# Patient Record
Sex: Male | Born: 1965 | Race: White | Hispanic: No | Marital: Married | Smoking: Current every day smoker
Health system: Southern US, Community
[De-identification: ages and names within clinical notes are randomized; demographics above are authoritative.]

## PROBLEM LIST (undated history)

## (undated) DIAGNOSIS — R911 Solitary pulmonary nodule: Secondary | ICD-10-CM

## (undated) DIAGNOSIS — I700 Atherosclerosis of aorta: Secondary | ICD-10-CM

## (undated) DIAGNOSIS — J439 Emphysema, unspecified: Secondary | ICD-10-CM

## (undated) HISTORY — PX: VENOUS THROMBECTOMY: SHX834

## (undated) HISTORY — DX: Solitary pulmonary nodule: R91.1

## (undated) HISTORY — DX: Emphysema, unspecified: J43.9

## (undated) HISTORY — DX: Atherosclerosis of aorta: I70.0

## (undated) HISTORY — PX: APPENDECTOMY: SHX54

---

## 2005-12-16 ENCOUNTER — Emergency Department (HOSPITAL_COMMUNITY): Admission: EM | Admit: 2005-12-16 | Discharge: 2005-12-16 | Payer: Self-pay | Admitting: Emergency Medicine

## 2005-12-21 ENCOUNTER — Ambulatory Visit (HOSPITAL_COMMUNITY): Admission: RE | Admit: 2005-12-21 | Discharge: 2005-12-21 | Payer: Self-pay | Admitting: *Deleted

## 2007-06-16 IMAGING — CR DG CHEST 2V
2 series · 2 of 2 positions shown · non-contrast
Comparison: None.

CLINICAL DATA: Pre-op/thrombophlebitis left leg. 
 CHEST - 2 VIEW:

[view not recorded (1 of 2)]
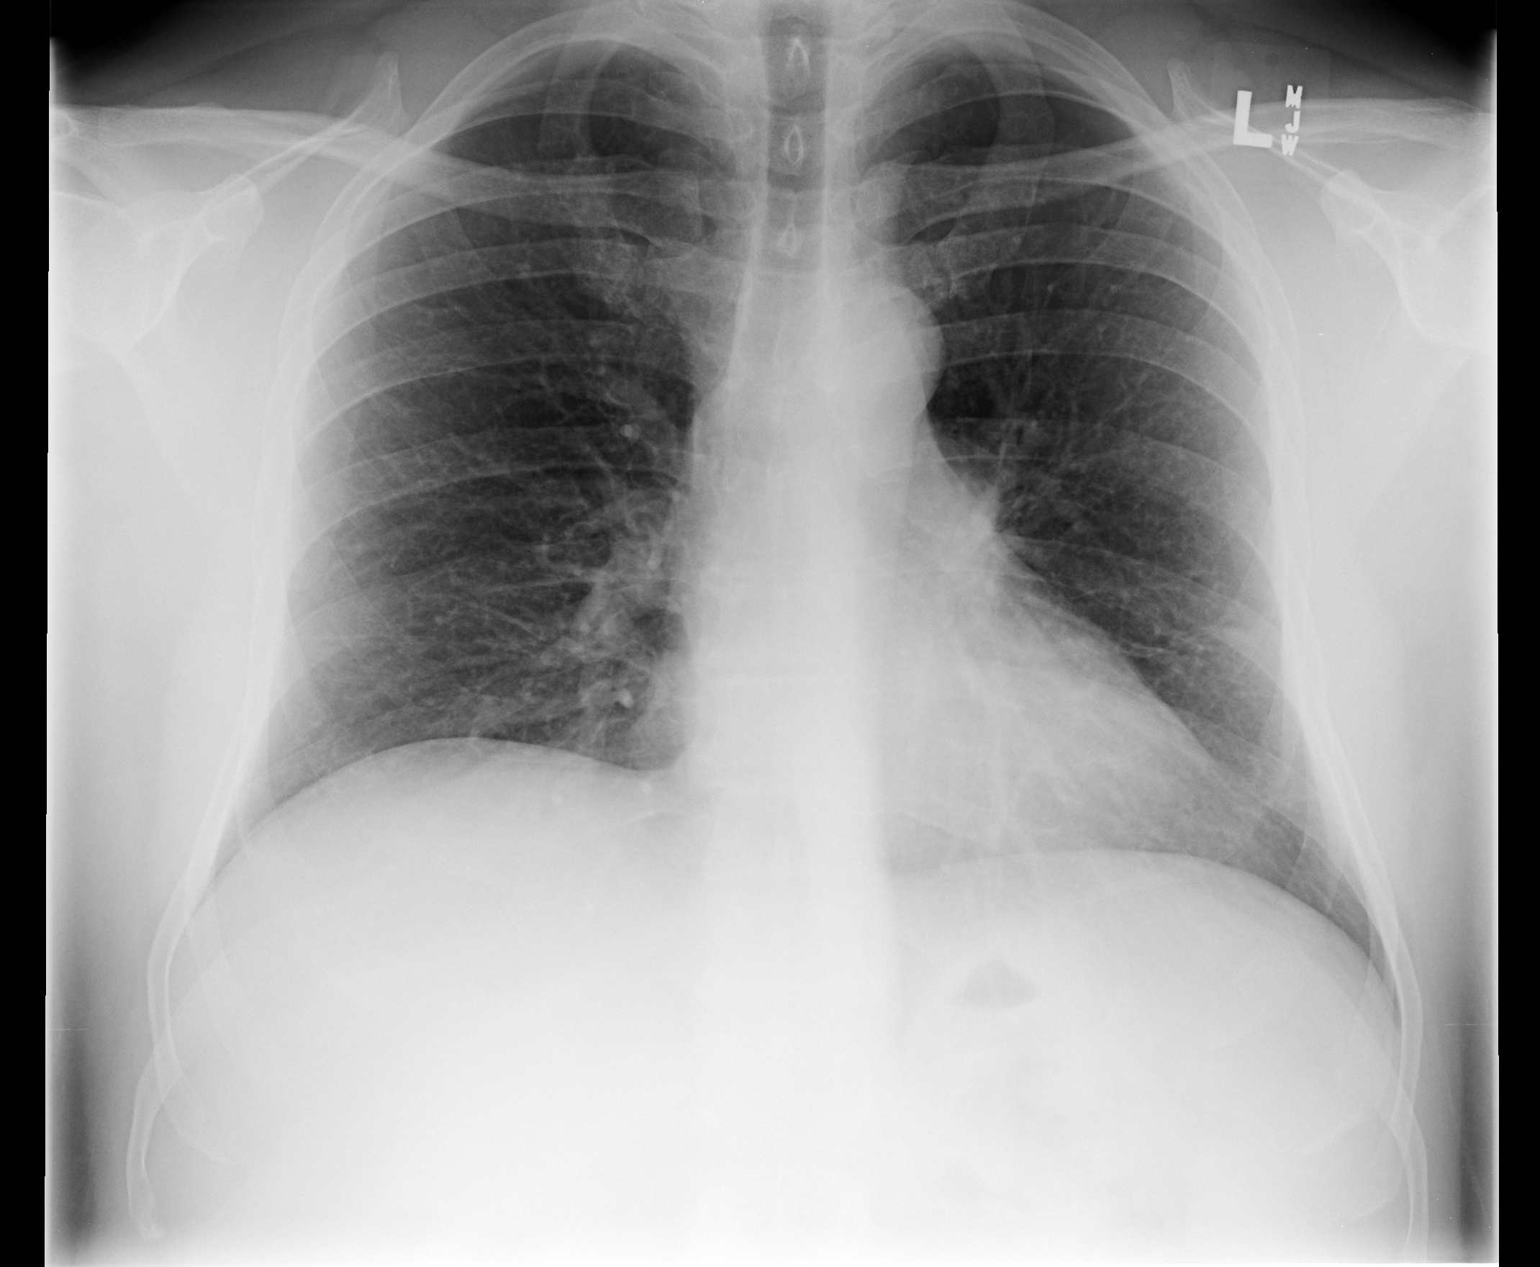

[view not recorded (2 of 2)]
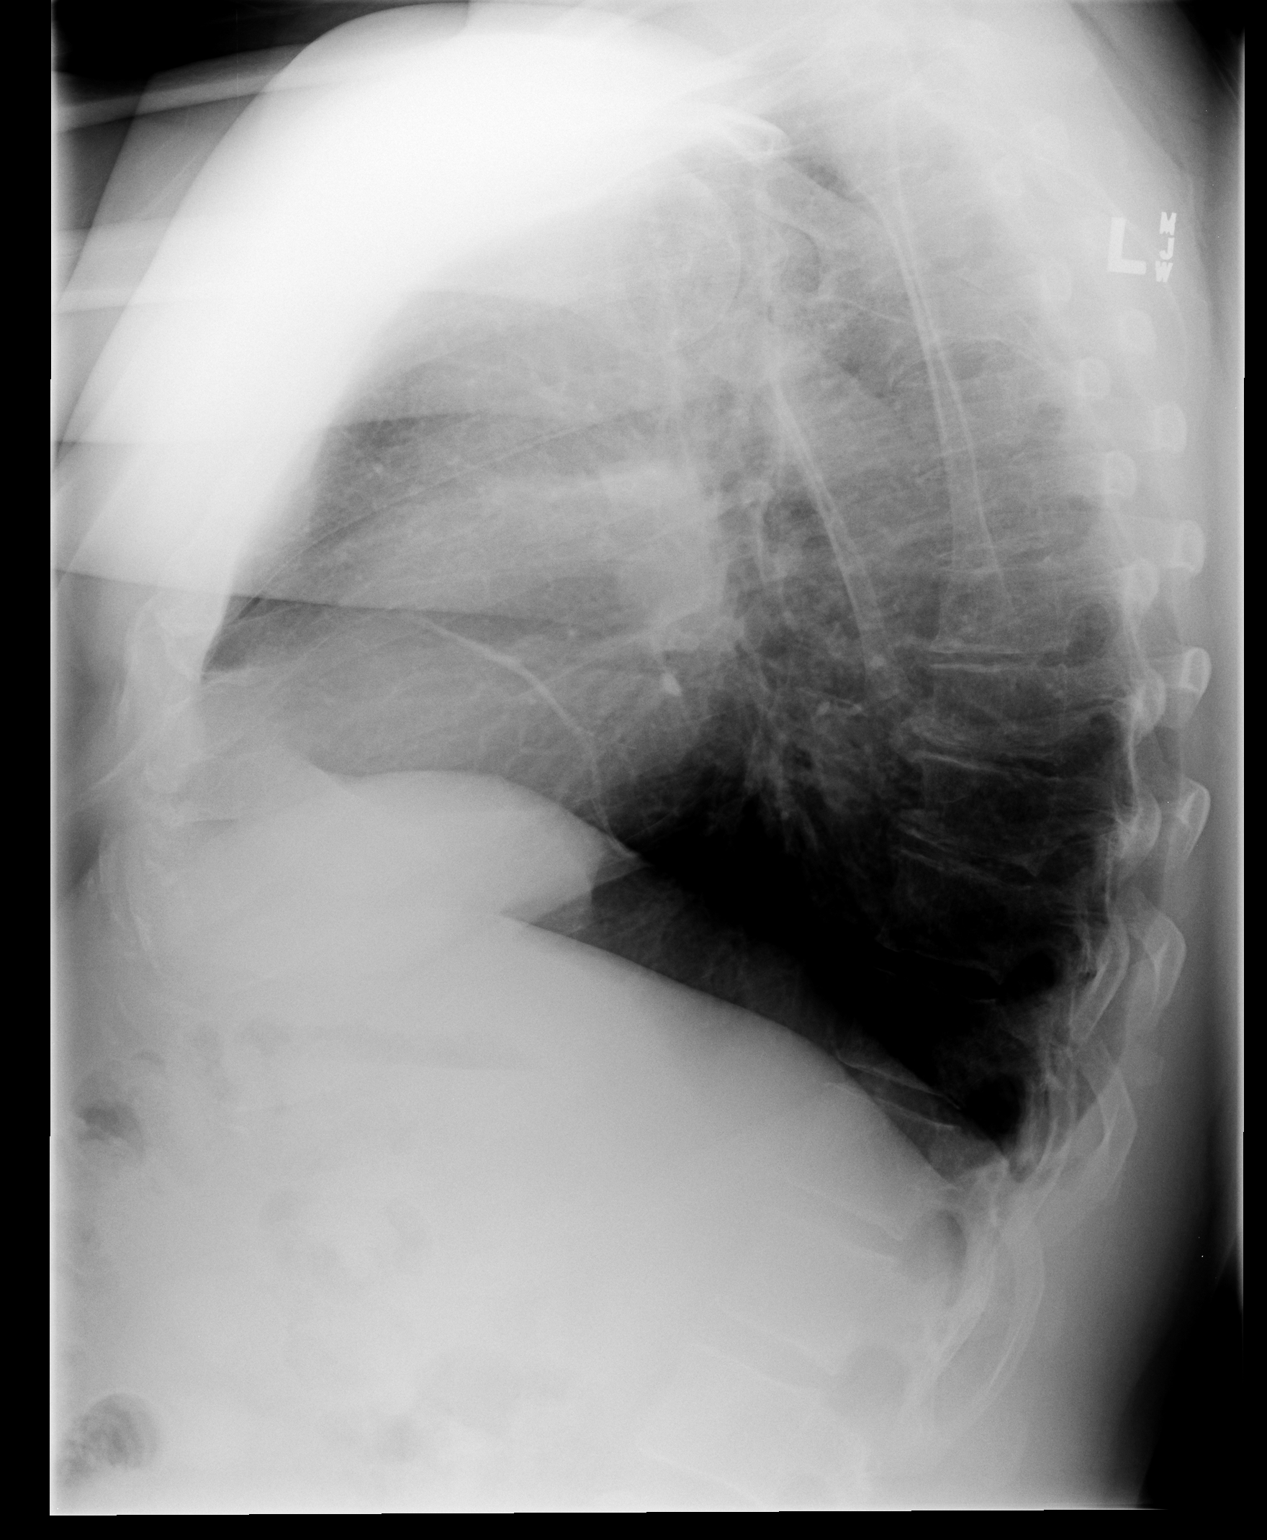

[2 of 2 positions shown; findings below may reference images not displayed]

FINDINGS: Heart size within normal limits.  Mild peribronchial thickening without active airspace disease.  Osseous structures intact. On the lateral view, there is a scar-like density projecting over the heart which cannot be seen with certainty on the PA view.
IMPRESSION: Chronic changes - no active disease.

## 2016-06-10 ENCOUNTER — Emergency Department (HOSPITAL_COMMUNITY)
Admission: EM | Admit: 2016-06-10 | Discharge: 2016-06-10 | Disposition: A | Discharge status: Home / Self Care | Payer: 59 | Attending: Emergency Medicine | Admitting: Emergency Medicine

## 2016-06-10 ENCOUNTER — Encounter (HOSPITAL_COMMUNITY): Payer: Self-pay | Admitting: Emergency Medicine

## 2016-06-10 ENCOUNTER — Emergency Department (HOSPITAL_COMMUNITY): Payer: 59

## 2016-06-10 DIAGNOSIS — Z7982 Long term (current) use of aspirin: Secondary | ICD-10-CM | POA: Diagnosis not present

## 2016-06-10 DIAGNOSIS — X501XXA Overexertion from prolonged static or awkward postures, initial encounter: Secondary | ICD-10-CM | POA: Insufficient documentation

## 2016-06-10 DIAGNOSIS — F17210 Nicotine dependence, cigarettes, uncomplicated: Secondary | ICD-10-CM | POA: Insufficient documentation

## 2016-06-10 DIAGNOSIS — Y929 Unspecified place or not applicable: Secondary | ICD-10-CM | POA: Insufficient documentation

## 2016-06-10 DIAGNOSIS — Y999 Unspecified external cause status: Secondary | ICD-10-CM | POA: Insufficient documentation

## 2016-06-10 DIAGNOSIS — Y9301 Activity, walking, marching and hiking: Secondary | ICD-10-CM | POA: Insufficient documentation

## 2016-06-10 DIAGNOSIS — M25462 Effusion, left knee: Secondary | ICD-10-CM | POA: Insufficient documentation

## 2016-06-10 DIAGNOSIS — S8992XA Unspecified injury of left lower leg, initial encounter: Secondary | ICD-10-CM | POA: Diagnosis present

## 2016-06-10 MED ORDER — NAPROXEN 500 MG PO TABS: 500.0000 mg | ORAL_TABLET | Freq: Two times a day (BID) | ORAL | 0 refills | Status: DC

## 2016-06-10 NOTE — ED Provider Notes (Signed)
[facility]Y DEPT Provider Note   CSN: [id number] Arrival date & time: 06/10/16  1423     History   Chief Complaint Chief Complaint  Patient presents with  . Knee Pain    left    HPI Harry Wyatt is a 50 y.o. male presenting with severe left knee pain and swelling which started suddenly yesterday while walking.  He endorses having pain in this knee for the past week, but denies injury.  He was carrying his grandson and walking when he felt a sudden severe popping sensation behind this knee, sudden onset of swelling and has not been able to weight bear since.  He was seen at [facility], diagnosed with an effusion, treated with crutches and brace. He feels there is a bigger problem than just swelling and desires further evaluation.  He was prescribed hydrocodone for pain but states has not taken any yet.  He has to travel with his job next week and is concerned about his lack of mobility.  The history is provided by the patient.    History reviewed. No pertinent past medical history.  There are no active problems to display for this patient.   Past Surgical History:  Procedure Laterality Date  . APPENDECTOMY    . VENOUS THROMBECTOMY     left leg about 12 years ago.       Home Medications    Prior to Admission medications   Medication Sig Start Date End Date Taking? Authorizing Provider  aspirin EC 81 MG tablet Take 81 mg by mouth 2 (two) times daily.   Yes Historical Provider, MD  naproxen (NAPROSYN) 500 MG tablet Take 1 tablet (500 mg total) by mouth 2 (two) times daily. 06/10/16   Burgess AmorJulie Maclain Cohron, PA-C    Family History No family history on file.  Social History Social History  Substance Use Topics  . Smoking status: Current Every Day Smoker    Packs/day: 0.50    Types: Cigarettes  . Smokeless tobacco: Never Used  . Alcohol use No     Allergies   Penicillins   Review of Systems Review of Systems  Constitutional: Negative for fever.    Musculoskeletal: Positive for arthralgias and joint swelling. Negative for myalgias.  Neurological: Negative for weakness and numbness.     Physical Exam Updated Vital Signs BP 155/98 (BP Location: Right Arm)   Pulse 78   Temp 97.9 F (36.6 C) (Oral)   Resp 18   Ht 6' (1.829 m)   Wt 110.2 kg   SpO2 95%   BMI 32.96 kg/m   Physical Exam  Constitutional: He appears well-developed and well-nourished.  HENT:  Head: Atraumatic.  Neck: Normal range of motion.  Cardiovascular:  Pulses:      Dorsalis pedis pulses are 2+ on the right side, and 2+ on the left side.  Pulses equal bilaterally  Musculoskeletal: He exhibits tenderness.       Left knee: He exhibits decreased range of motion and effusion. He exhibits no deformity, no erythema, normal alignment, no LCL laxity and no MCL laxity. Tenderness found.  ttp popliteal space and across upper gastroc.  No palpable deformity.   No pain but increased pressure sensation upper calf with ankle flexion.   Neurological: He is alert. He has normal strength. He displays normal reflexes. No sensory deficit.  Skin: Skin is warm and dry.  Psychiatric: He has a normal mood and affect.     ED Treatments / Results  Labs (all labs ordered are  listed, but only abnormal results are displayed) Labs Reviewed - No data to display  EKG  EKG Interpretation None       Radiology Ct Knee Left Wo Contrast  Result Date: 06/10/2016 CLINICAL DATA:  Generalized left knee pain after hearing a popping sensation when walking yesterday. No known knee injury. EXAM: CT OF THE LEFT KNEE WITHOUT CONTRAST TECHNIQUE: Multidetector CT imaging of the left knee was performed according to the standard protocol. Multiplanar CT image reconstructions were also generated. COMPARISON:  Radiographs 06/09/2016 FINDINGS: Bones/Joint/Cartilage The mineralization and alignment are normal. No evidence of acute fracture, dislocation or avascular necrosis. The joint spaces appear  relatively preserved. There is a small to moderate joint effusion which appears mildly complex. No loose bodies are seen. Small Baker's cyst. Ligaments Suboptimally assessed by CT. Muscles and Tendons The extensor mechanism is intact. 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 No acute soft tissue findings seen. IMPRESSION: 1. No acute osseous findings or significant arthropathic changes. 2. Small to moderate mildly complex knee joint effusion suggesting synovitis, but nonspecific. 3. If there is concern of internal derangement, consider MRI. Electronically Signed   By: Carey BullocksWilliam  Veazey M.D.   On: 06/10/2016 16:56    Procedures Procedures (including critical care time)  Medications Ordered in ED Medications - No data to display   Initial Impression / Assessment and Plan / ED Course  I have reviewed the triage vital signs and the nursing notes.  Pertinent labs & imaging results that were available during my care of the patient were reviewed by me and considered in my medical decision making (see chart for details).  Clinical Course     Pt advised he will need orthopedics for further eval and management. He presented with a knee immobilizer and crutches, advised he needs to continue using to avoid weight bearing,  Ice, elevate, as much as possible. Referrals given.   Final Clinical Impressions(s) / ED Diagnoses   Final diagnoses:  Effusion of left knee    New Prescriptions Discharge Medication List as of 06/10/2016  5:35 PM    START taking these medications   Details  naproxen (NAPROSYN) 500 MG tablet Take 1 tablet (500 mg total) by mouth 2 (two) times daily., Starting Wed 06/10/2016, Print         Burgess AmorJulie Siddhartha Hoback, PA-C 06/12/16 1421    Lavera Guiseana Duo Liu, MD 06/12/16 [phone]

## 2016-06-10 NOTE — ED Triage Notes (Signed)
Was walking and heard a pop to left knee.  Denies injury or fall.  Was carrying grandson when he hear a pop.  Rates pain 10/10.  Having pain of left knee for last week.  notice swelling and [facility].  Given knee immobilizer and crutches.  Was told to follow with PCP but could not be seen today.  Pt says he is going oft of town Monday and need to find out what's going on with his knee.

## 2016-06-10 NOTE — ED Notes (Signed)
Pt alert & oriented x4, stable gait. Patient given discharge instructions, paperwork & prescription(s). Patient  instructed to stop at the registration desk to finish any additional paperwork. Patient verbalized understanding. Pt left department w/ no further questions. 

## 2016-06-10 NOTE — ED Notes (Signed)
Pt states was holding grandson & heard a "pop" in his left knee. Pt has had some swelling in that knee on & off. Was seen [facility]. Given knee immobilizer & crutches. Pt here for a second opinion.

## 2016-07-08 DIAGNOSIS — S83242A Other tear of medial meniscus, current injury, left knee, initial encounter: Secondary | ICD-10-CM | POA: Diagnosis not present

## 2017-12-04 IMAGING — CT CT KNEE*L* W/O CM
3 series · 15 of 33 positions shown, 18 images · non-contrast
Comparison: Radiographs 06/09/2016

CLINICAL DATA: Generalized left knee pain after hearing a popping
sensation when walking yesterday. No known knee injury.

EXAM:
CT OF THE LEFT KNEE WITHOUT CONTRAST
TECHNIQUE: Multidetector CT imaging of the left knee was performed according to
the standard protocol. Multiplanar CT image reconstructions were
also generated.

[Series 7: axial st · axial · 0.31mm/px · z∈[+547,+695]mm · 7 of 177 slices shown, 9 images]
[im 14/177  soft-tissue]
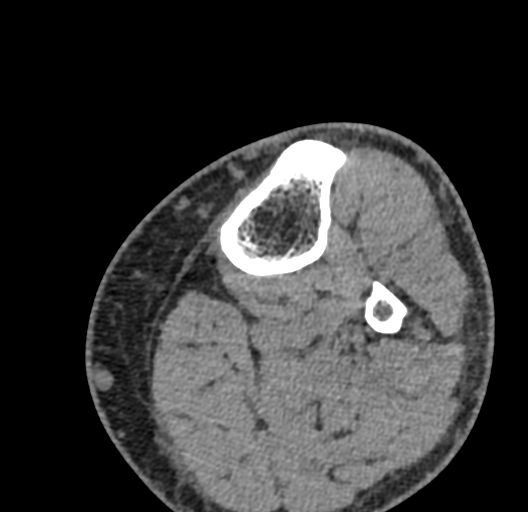
[im 14/177  bone]
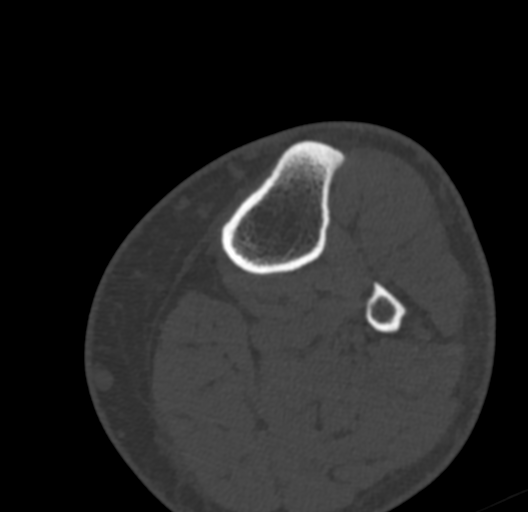
[im 41/177  bone]
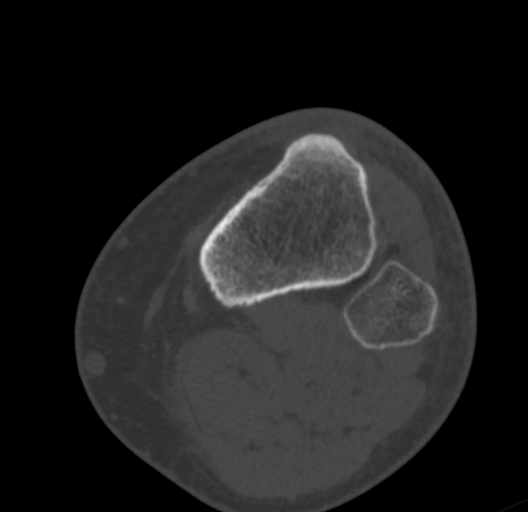
[im 68/177  bone]
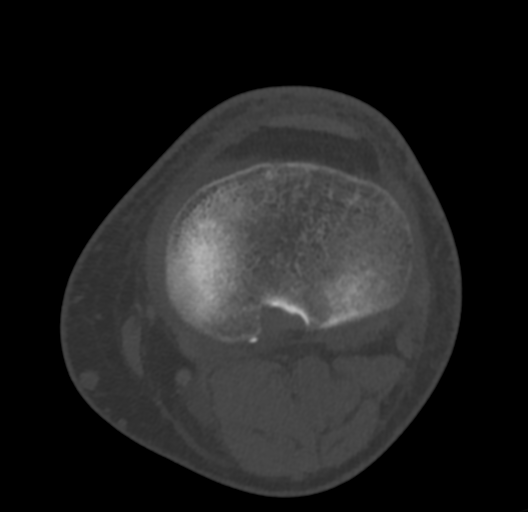
[im 95/177  bone]
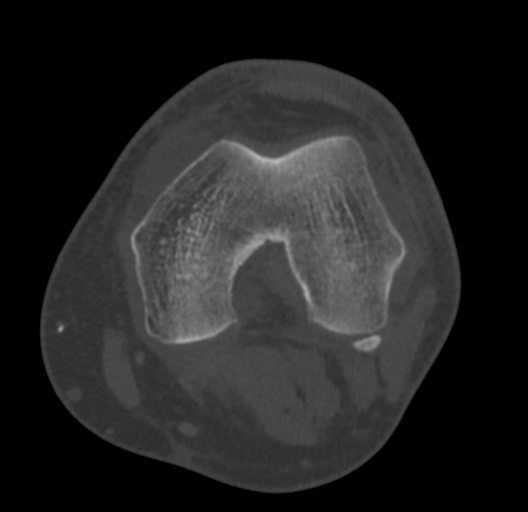
[im 109/177  soft-tissue]
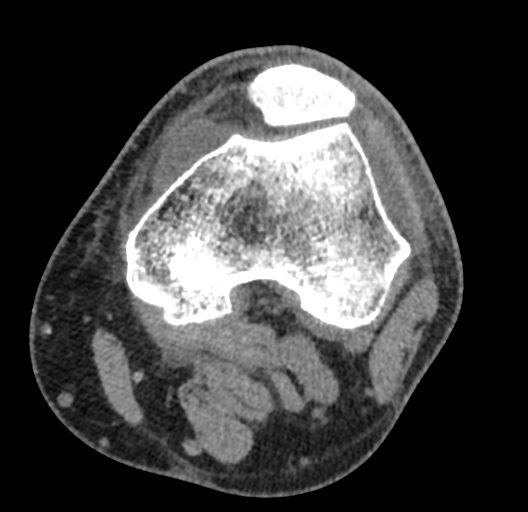
[im 109/177  bone]
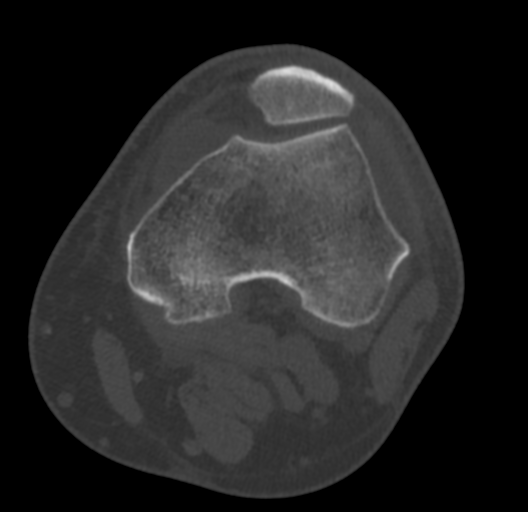
[im 136/177  bone]
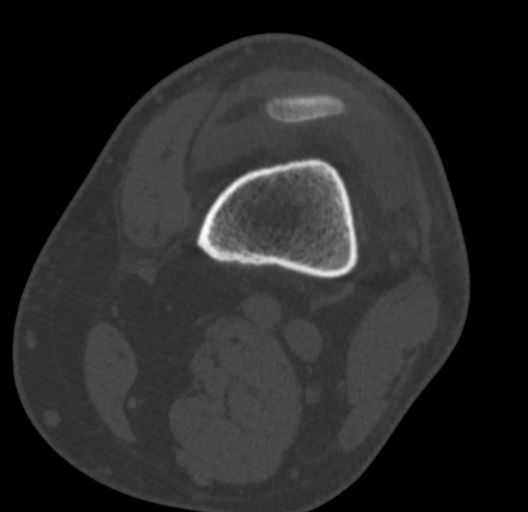
[im 163/177  bone]
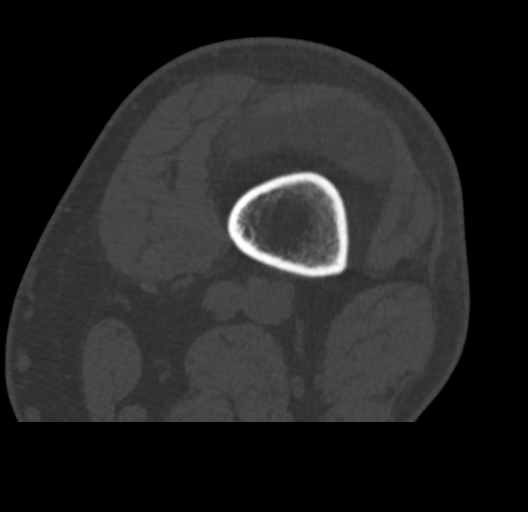

[Series 8: cor st · coronal · 0.31mm/px · 3 of 162 slices shown]
[im 33/162  bone]
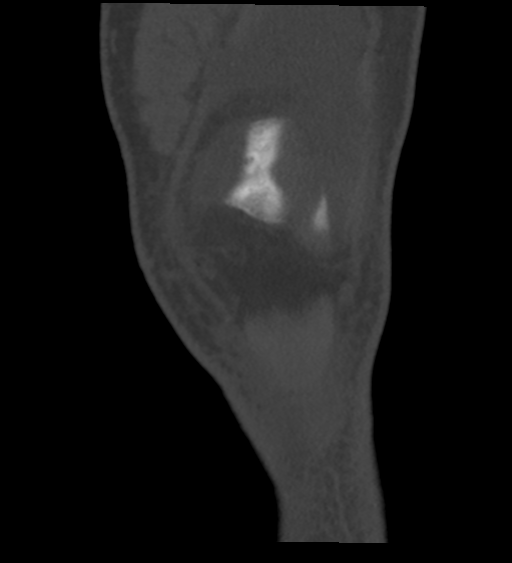
[im 65/162  bone]
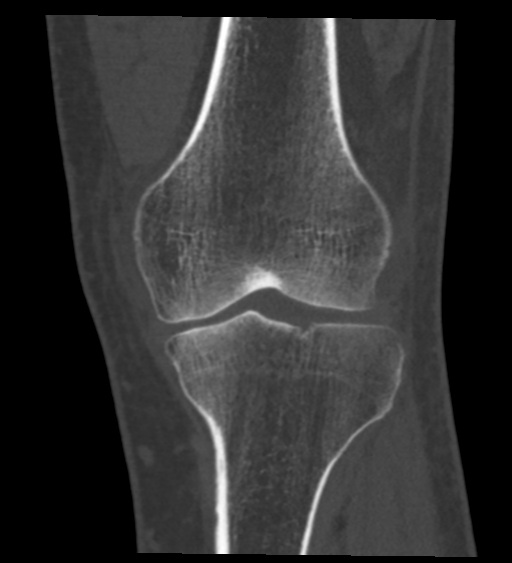
[im 97/162  bone]
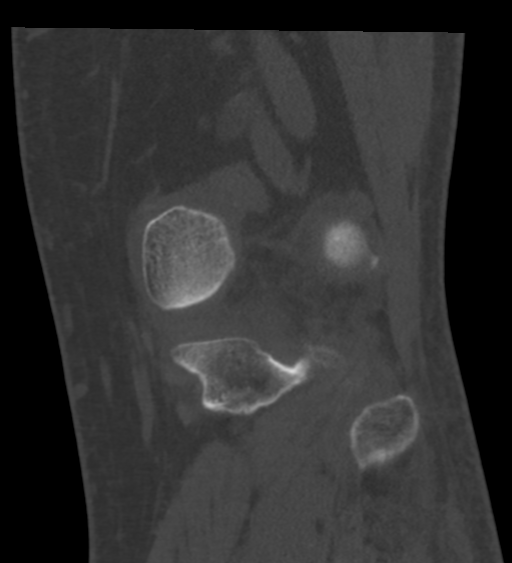

[Series 9: sag st · sagittal · 0.31mm/px · 5 of 151 slices shown, 6 images]
[im 51/151  bone]
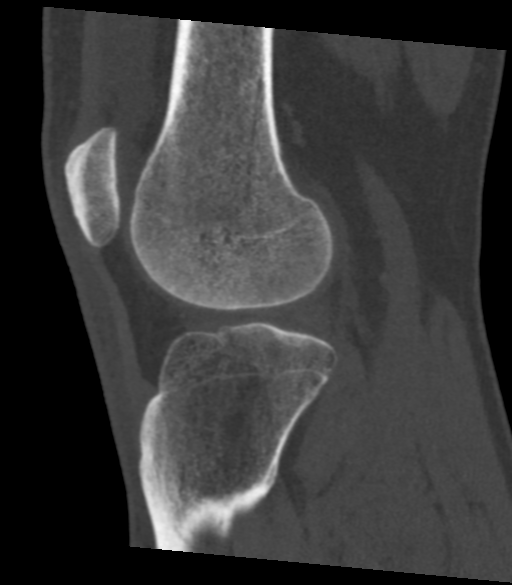
[im 63/151  bone]
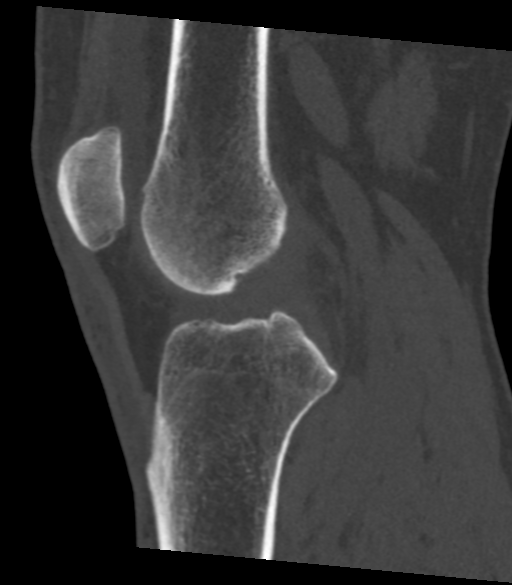
[im 76/151  soft-tissue]
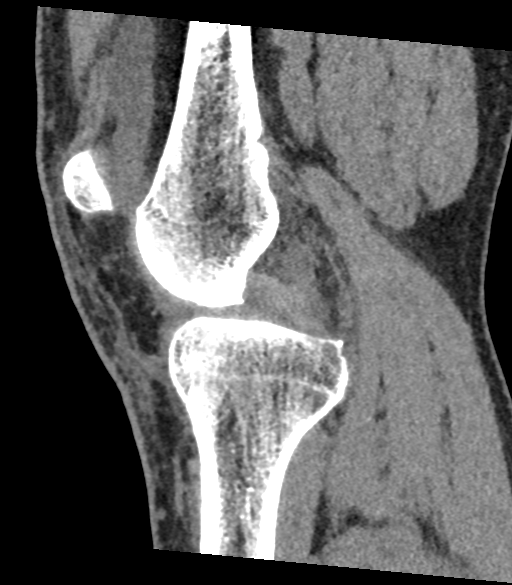
[im 76/151  bone]
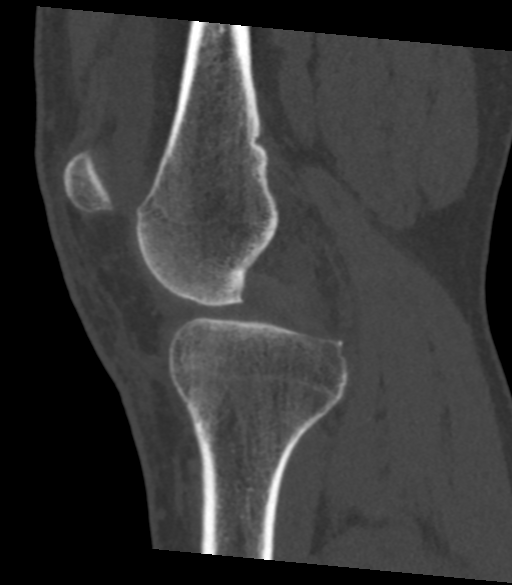
[im 88/151  bone]
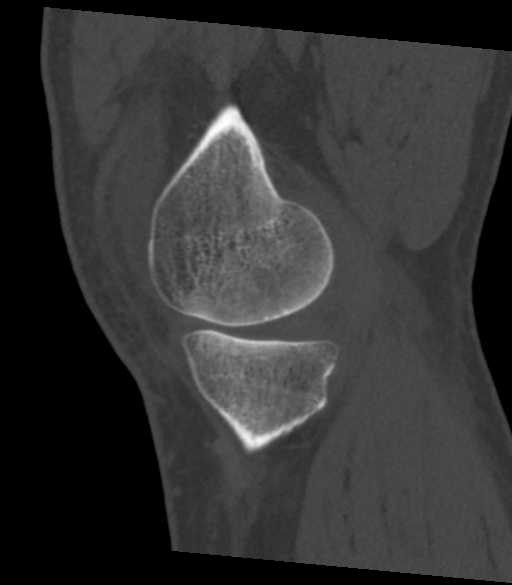
[im 101/151  bone]
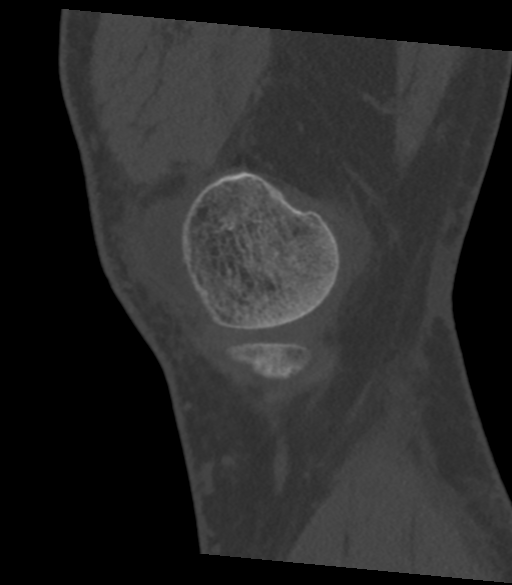

[15 of 33 positions shown; findings below may reference images not displayed]

FINDINGS: Bones/Joint/Cartilage

The mineralization and alignment are normal. No evidence of acute
fracture, dislocation or avascular necrosis. The joint spaces appear
relatively preserved. There is a small to moderate joint effusion
which appears mildly complex. No loose bodies are seen. Small
Baker's cyst.

Ligaments

Suboptimally assessed by CT.

Muscles and Tendons

The extensor mechanism is intact.

Soft tissues

Superficial venous calcifications, likely from chronic
thrombophlebitis. No acute soft tissue findings seen.
IMPRESSION: 1. No acute osseous findings or significant arthropathic changes.
2. Small to moderate mildly complex knee joint effusion suggesting
synovitis, but nonspecific.
3. If there is concern of internal derangement, consider MRI.

## 2018-08-01 DIAGNOSIS — J069 Acute upper respiratory infection, unspecified: Secondary | ICD-10-CM | POA: Diagnosis not present

## 2018-08-01 DIAGNOSIS — J111 Influenza due to unidentified influenza virus with other respiratory manifestations: Secondary | ICD-10-CM | POA: Diagnosis not present

## 2018-08-01 DIAGNOSIS — Z6836 Body mass index (BMI) 36.0-36.9, adult: Secondary | ICD-10-CM | POA: Diagnosis not present

## 2022-01-03 ENCOUNTER — Encounter: Payer: Self-pay | Admitting: Cardiology

## 2022-01-03 NOTE — Progress Notes (Signed)
Cardiology Office Note  Date: 01/07/2022   ID: Harry Wyatt, DOB 12-Oct-1965, MRN [id number]  PCP:  Donetta Potts, MD  Cardiologist:  Nona Dell, MD Electrophysiologist:  None   Chief Complaint  Patient presents with   Coronary atherosclerosis by CT imaging.    History of Present Illness: Harry Wyatt is a 56 y.o. male referred for cardiology consultation by Mr. Leavy Cella PA-C at [facility] for the evaluation of coronary atherosclerosis.  He is here today with his wife.  He had coronary and aortic atherosclerosis noted incidentally by chest CT in April of this year done for pulmonary screening.  Report indicates a benign appearing right upper lobe nodule with recommended reimaging in 6 months - this is being followed by PCP.  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  No regular exercise plan at this time, but with typical activities he does not describe any exertional chest tightness or breathlessness beyond NYHA class I.  No syncope.  I reviewed his medications, he has been on low-dose aspirin, omega-3 supplements, and also red yeast rice.  LDL was 102 in March, triglycerides 285.  No definite history of type 2 diabetes mellitus.  No reported family history of premature CAD.  His father died at age 39 with cancer.  I personally reviewed his ECG today which shows sinus rhythm with decreased R wave progression and nonspecific ST-T wave changes.  Today we discussed recommendation for statin therapy given documented atherosclerosis for further risk reduction, also objective ischemic testing.  Past Medical History:  Diagnosis Date   Aortic atherosclerosis (HCC)    Emphysema lung (HCC)    Pulmonary nodule     Past Surgical History:  Procedure Laterality Date   APPENDECTOMY     VENOUS THROMBECTOMY     left leg about 12 years ago.    Current Outpatient Medications  Medication Sig Dispense Refill   aspirin EC 81 MG tablet Take 81 mg  by mouth 2 (two) times daily.     Multiple Vitamin (MULTIVITAMIN) tablet Take 1 tablet by mouth daily.     Omega-3 Fatty Acids (FISH OIL PO) Take 1 capsule by mouth in the morning and at bedtime.     rosuvastatin (CRESTOR) 10 MG tablet Take 1 tablet (10 mg total) by mouth daily. 30 tablet 6   No current facility-administered medications for this visit.   Allergies:  Penicillins   Social History: The patient  reports that he has been smoking cigarettes. He has been smoking an average of .5 packs per day. He has never used smokeless tobacco. He reports that he does not drink alcohol and does not use drugs.   Family History: The patient's family history includes Atrial fibrillation in his mother; Lymphoma in his father.   ROS: No palpitations or syncope.  No orthopnea or PND.  Physical Exam: VS:  BP (!) 148/94   Pulse 81   Ht 6' (1.829 m)   Wt 262 lb 3.2 oz (118.9 kg)   SpO2 95%   BMI 35.56 kg/m , BMI Body mass index is 35.56 kg/m.  Wt Readings from Last 3 Encounters:  01/07/22 262 lb 3.2 oz (118.9 kg)  06/10/16 243 lb (110.2 kg)    General: Patient appears comfortable at rest. HEENT: Conjunctiva and lids normal, oropharynx clear. Neck: Supple, no elevated JVP or carotid bruits, no thyromegaly. Lungs: Clear to auscultation, nonlabored breathing at rest. Cardiac: Regular rate and rhythm, no S3 or significant  systolic murmur, no pericardial rub. Abdomen: Soft, nontender, bowel sounds present. Extremities: No pitting edema, distal pulses 2+. Skin: Warm and dry. Musculoskeletal: No kyphosis. Neuropsychiatric: Alert and oriented x3, affect grossly appropriate.  ECG:   No ECGs available for review today.  Recent Labwork:  March 2023: Hemoglobin 16.5, platelets 237, BUN 16, creatinine 1.3, potassium 4.8, AST 17, ALT 27, cholesterol 176, triglycerides 285, HDL 28, LDL 102, TSH 2.12  Other Studies Reviewed Today:  Chest CT 10/14/2021 [facility]): FINDINGS:  Cardiovascular:  Normal heart size. No significant pericardial  effusion/thickening. Left anterior descending and right coronary  atherosclerosis. Mildly atherosclerotic nonaneurysmal thoracic  aorta. Normal caliber pulmonary arteries.   Mediastinum/Nodes: No discrete thyroid nodules. Unremarkable  esophagus. No pathologically enlarged axillary, mediastinal or hilar  lymph nodes, noting limited sensitivity for the detection of hilar  adenopathy on this noncontrast study.   Lungs/Pleura: No pneumothorax. No pleural effusion. Moderate  centrilobular emphysema with diffuse bronchial wall thickening. No  acute consolidative airspace disease or lung masses. Numerous solid  pulmonary nodules scattered in both lungs, largest 6.9 mm in volume  derived mean diameter in the anteromedial basilar right upper lobe  (series 4/image 156).   Upper abdomen: No acute abnormality.   Musculoskeletal: No aggressive appearing focal osseous lesions.  Moderate thoracic spondylosis.  1. Lung-RADS 3, probably benign findings. Short-term follow-up in 6  months is recommended with repeat low-dose chest CT without contrast  (please use the following order, "CT CHEST LCS NODULE FOLLOW-UP W/O  CM"). Dominant 6.9 mm right upper lobe solid pulmonary nodule.  2. Two-vessel coronary atherosclerosis.  3. Aortic Atherosclerosis (ICD10-I70.0) and Emphysema (ICD10-J43.9).   Assessment and Plan:  1.  Coronary and aortic atherosclerosis documented by CT imaging.  Two-vessel coronary distribution described.  No definite angina, ECG reviewed and nonspecific.  He has not undergone any prior formal ischemic testing.  Today we discussed rationale for statin therapy.  He will stop red yeast rice and we will initiate Crestor 10 mg daily, if tolerated would recheck FLP and LFTs in 3 months.  Also obtain an exercise Myoview to assess for potential obstructive CAD.  Generally reviewed recommendations for diet, exercise, and weight loss as well.  2.   LDL 102, will plan to get better reduction toward goal in light of atherosclerosis as discussed above.  Approximately 50% reduction in LDL would be optimal.  3.  Blood pressure elevated today, no standing history of essential hypertension, could be situational.  Would continue to keep an eye on this with PCP.  Weight loss would also be beneficial.  Medication Adjustments/Labs and Tests Ordered: Current medicines are reviewed at length with the patient today.  Concerns regarding medicines are outlined above.   Tests Ordered: Orders Placed This Encounter  Procedures   NM Myocar Multi W/Spect W/Wall Motion / EF   EKG 12-Lead    Medication Changes: Meds ordered this encounter  Medications   rosuvastatin (CRESTOR) 10 MG tablet    Sig: Take 1 tablet (10 mg total) by mouth daily.    Dispense:  30 tablet    Refill:  6    Stopping Red Yeast Rice, med changed 01/07/2022    Disposition:  Follow up  test results.  Signed, Jonelle Sidle, MD, [facility] 01/07/2022 9:37 AM    [facility] [address] [place], [place], [place] [address] Phone: [phone]; Fax: [phone]

## 2022-01-07 ENCOUNTER — Ambulatory Visit: Payer: 59 | Admitting: Cardiology

## 2022-01-07 ENCOUNTER — Encounter: Payer: Self-pay | Admitting: *Deleted

## 2022-01-07 ENCOUNTER — Encounter: Payer: Self-pay | Admitting: Cardiology

## 2022-01-07 VITALS — BP 148/94 | HR 81 | Ht 72.0 in | Wt 262.2 lb

## 2022-01-07 DIAGNOSIS — I2510 Atherosclerotic heart disease of native coronary artery without angina pectoris: Secondary | ICD-10-CM | POA: Diagnosis not present

## 2022-01-07 DIAGNOSIS — R9431 Abnormal electrocardiogram [ECG] [EKG]: Secondary | ICD-10-CM | POA: Diagnosis not present

## 2022-01-07 DIAGNOSIS — E782 Mixed hyperlipidemia: Secondary | ICD-10-CM | POA: Diagnosis not present

## 2022-01-07 DIAGNOSIS — R030 Elevated blood-pressure reading, without diagnosis of hypertension: Secondary | ICD-10-CM | POA: Diagnosis not present

## 2022-01-07 MED ORDER — ROSUVASTATIN CALCIUM 10 MG PO TABS: 10.0000 mg | ORAL_TABLET | Freq: Every day | ORAL | 6 refills | Status: AC

## 2022-01-07 NOTE — Patient Instructions (Addendum)
Medication Instructions:  Stop Red Yeast Rice  Begin Crestor 10mg  daily  Continue all other medications.     Labwork: FLP, LFT - due in 3 months - please call the office for order if able to tolerate the Crestor  Testing/Procedures: Your physician has requested that you have an exercise stress myoview. For further information please visit . Please follow instruction sheet, as given.   Follow-Up: Office will contact with results via phone or letter.    Pending test results   Any Other Special Instructions Will Be Listed Below (If Applicable).   If you need a refill on your cardiac medications before your next appointment, please call your pharmacy.

## 2022-01-16 ENCOUNTER — Ambulatory Visit (HOSPITAL_COMMUNITY)
Admission: RE | Admit: 2022-01-16 | Discharge: 2022-01-16 | Disposition: A | Discharge status: Home / Self Care | Payer: 59 | Source: Ambulatory Visit | Attending: Cardiology | Admitting: Cardiology

## 2022-01-16 ENCOUNTER — Encounter (HOSPITAL_COMMUNITY): Payer: Self-pay

## 2022-01-16 ENCOUNTER — Encounter (HOSPITAL_BASED_OUTPATIENT_CLINIC_OR_DEPARTMENT_OTHER)
Admission: RE | Admit: 2022-01-16 | Discharge: 2022-01-16 | Disposition: A | Discharge status: Home / Self Care | Payer: 59 | Source: Ambulatory Visit | Attending: Cardiology | Admitting: Cardiology

## 2022-01-16 DIAGNOSIS — I2510 Atherosclerotic heart disease of native coronary artery without angina pectoris: Secondary | ICD-10-CM

## 2022-01-16 LAB — NM MYOCAR MULTI W/SPECT W/WALL MOTION / EF
Angina Index: 0
Estimated workload: 7
Exercise duration (min): 5 min
Exercise duration (sec): 12 s
LV dias vol: 125 mL (ref 62–150)
LV sys vol: 46 mL
MPHR: 163 {beats}/min
Nuc Stress EF: 63 %
Peak HR: 142 {beats}/min
Percent HR: 87 %
RATE: 0.3
RPE: 16
Rest HR: 74 {beats}/min
Rest Nuclear Isotope Dose: 11 mCi
SDS: 1
SRS: 2
SSS: 3
Stress Nuclear Isotope Dose: 32 mCi
TID: 1.01

## 2022-01-16 MED ORDER — TECHNETIUM TC 99M TETROFOSMIN IV KIT
30.0000 | PACK | Freq: Once | INTRAVENOUS | Status: AC | PRN
  Administered 2022-01-16: 32 via INTRAVENOUS

## 2022-01-16 MED ORDER — SODIUM CHLORIDE FLUSH 0.9 % IV SOLN
INTRAVENOUS | Status: AC
  Administered 2022-01-16: 10 mL via INTRAVENOUS
  Filled 2022-01-16: qty 10

## 2022-01-16 MED ORDER — REGADENOSON 0.4 MG/5ML IV SOLN
INTRAVENOUS | Status: AC
  Filled 2022-01-16: qty 5

## 2022-01-16 MED ORDER — TECHNETIUM TC 99M TETROFOSMIN IV KIT
10.0000 | PACK | Freq: Once | INTRAVENOUS | Status: AC | PRN
  Administered 2022-01-16: 11 via INTRAVENOUS

## 2022-05-05 ENCOUNTER — Encounter: Payer: Self-pay | Admitting: *Deleted

## 2022-07-19 NOTE — Progress Notes (Unsigned)
Cardiology Office Note  Date: 07/20/2022   ID: Harry Wyatt, DOB March 12, 1966, MRN [id number]  PCP:  [place], MD  Cardiologist:  Rozann Lesches, MD Electrophysiologist:  None   Chief Complaint  Patient presents with   Cardiac follow-up    History of Present Illness: Harry Wyatt is a 57 y.o. male last seen in July 2023.  He is here for a routine visit.  Reports no angina, NYHA class II dyspnea, no palpitations or syncope.  Continues to work full-time.  We went over his medications, he has tolerated Crestor and had excellent LDL control at 40 when last checked.  Blood pressure elevated today, he does have a cuff to check it at home.  We discussed weight loss, also keeping follow-up with Dr. Jimmye Norman as he may ultimately need antihypertensive therapy.  Past Medical History:  Diagnosis Date   Aortic atherosclerosis (HCC)    Emphysema lung (HCC)    Pulmonary nodule     Current Outpatient Medications  Medication Sig Dispense Refill   aspirin EC 81 MG tablet Take 81 mg by mouth 2 (two) times daily.     Multiple Vitamin (MULTIVITAMIN) tablet Take 1 tablet by mouth daily.     Omega-3 Fatty Acids (FISH OIL PO) Take 1 capsule by mouth in the morning and at bedtime.     rosuvastatin (CRESTOR) 10 MG tablet Take 1 tablet (10 mg total) by mouth daily. 30 tablet 6   No current facility-administered medications for this visit.   Allergies:  Penicillins   ROS: No orthopnea or PND.  Physical Exam: VS:  BP (!) 140/96   Pulse 79   Ht 6' (1.829 m)   Wt 269 lb 9.6 oz (122.3 kg)   SpO2 94%   BMI 36.56 kg/m , BMI Body mass index is 36.56 kg/m.  Wt Readings from Last 3 Encounters:  07/20/22 269 lb 9.6 oz (122.3 kg)  01/07/22 262 lb 3.2 oz (118.9 kg)  06/10/16 243 lb (110.2 kg)    General: Patient appears comfortable at rest. HEENT: Conjunctiva and lids normal. Cardiac: Regular rate and rhythm, no S3 or significant systolic murmur, no pericardial rub.  ECG:  An ECG  dated 01/07/2022 was personally reviewed today and demonstrated:  Sinus rhythm with decreased R wave progression and nonspecific ST-T changes.  Recent Labwork:  Exercise Myoview 01/16/2022:   T wave inversions present in inferolateral leads at baseline.   Exercise capacity was moderately impaired. Patient exercised for 5 min and 12 sec. Maximum HR of 142 bpm. MPHR 87.0 %. Peak METS 7.0 . The patient experienced no angina during the test.   There was pseudonormalization of baseline T wave abnormality during exercise.   Diaphragmatic attenuation artifact was present.   LV perfusion is abnormal. There is no evidence of ischemia. There is no evidence of infarction. Defect 1: There is a medium defect with mild reduction in uptake present in the apical to basal inferior location(s) and basal inferolateral locations that are fixed. There is normal wall motion in the defect area.   Left ventricular function is normal. Nuclear stress EF: 63 %.   Findings are consistent with no prior ischemia. The study is low risk.  Other Studies Reviewed Today:  September 2023: BUN 15, creatinine 1.05, potassium 4.7, AST 16, ALT 26, cholesterol 104, triglycerides 237, HDL 27, LDL 40, hemoglobin A1c 6.1%  Assessment and Plan:  1.  Coronary and aortic atherosclerosis by CT imaging.  He remains asymptomatic and had no clear  evidence of ischemia by exercise Myoview in July 2023.  Continue observation at this point.  He is on low-dose aspirin along with Crestor, LDL 40 at last check.  2.  Elevated blood pressure.  We discussed weight loss.  I asked him to keep follow-up with Dr. Jimmye Norman and continue to track blood pressure readings at home.  May need to consider antihypertensive therapy ultimately.  Medication Adjustments/Labs and Tests Ordered: Current medicines are reviewed at length with the patient today.  Concerns regarding medicines are outlined above.   Tests Ordered: No orders of the defined types were placed in  this encounter.   Medication Changes: No orders of the defined types were placed in this encounter.   Disposition:  Follow up  1 year.  Signed, Satira Sark, MD, [facility] 07/20/2022 9:19 AM    [facility] [place], [place], [place] [address] Phone: [phone]; Fax: [phone]

## 2022-07-20 ENCOUNTER — Encounter: Payer: Self-pay | Admitting: Cardiology

## 2022-07-20 ENCOUNTER — Ambulatory Visit: Payer: 59 | Attending: Cardiology | Admitting: Cardiology

## 2022-07-20 VITALS — BP 140/96 | HR 79 | Ht 72.0 in | Wt 269.6 lb

## 2022-07-20 DIAGNOSIS — I2510 Atherosclerotic heart disease of native coronary artery without angina pectoris: Secondary | ICD-10-CM

## 2022-07-20 NOTE — Patient Instructions (Signed)
Medication Instructions:  Your physician recommends that you continue on your current medications as directed. Please refer to the Current Medication list given to you today.   Labwork: None today  Testing/Procedures: None today  Follow-Up: 1 year  Any Other Special Instructions Will Be Listed Below (If Applicable).  If you need a refill on your cardiac medications before your next appointment, please call your pharmacy.
